# Patient Record
Sex: Male | Born: 1985 | Race: Black or African American | Hispanic: No | Marital: Married
Health system: Southern US, Community
[De-identification: ages and names within clinical notes are randomized; demographics above are authoritative.]

---

## 2013-04-05 ENCOUNTER — Emergency Department (HOSPITAL_COMMUNITY)
Admission: EM | Admit: 2013-04-05 | Discharge: 2013-04-05 | Disposition: A | Discharge status: Home / Self Care | Payer: No Typology Code available for payment source | Attending: Emergency Medicine | Admitting: Emergency Medicine

## 2013-04-05 ENCOUNTER — Encounter (HOSPITAL_COMMUNITY): Payer: Self-pay | Admitting: *Deleted

## 2013-04-05 DIAGNOSIS — S139XXA Sprain of joints and ligaments of unspecified parts of neck, initial encounter: Secondary | ICD-10-CM | POA: Insufficient documentation

## 2013-04-05 DIAGNOSIS — Y9389 Activity, other specified: Secondary | ICD-10-CM | POA: Insufficient documentation

## 2013-04-05 DIAGNOSIS — S161XXA Strain of muscle, fascia and tendon at neck level, initial encounter: Secondary | ICD-10-CM

## 2013-04-05 DIAGNOSIS — Y92410 Unspecified street and highway as the place of occurrence of the external cause: Secondary | ICD-10-CM | POA: Insufficient documentation

## 2013-04-05 MED ORDER — OXYCODONE-ACETAMINOPHEN 5-325 MG PO TABS: 1.0000 | ORAL_TABLET | Freq: Four times a day (QID) | ORAL | Status: DC | PRN

## 2013-04-05 MED ORDER — IBUPROFEN 400 MG PO TABS
600.0000 mg | ORAL_TABLET | Freq: Once | ORAL | Status: AC
  Administered 2013-04-05: 600 mg via ORAL
  Filled 2013-04-05 (×2): qty 1

## 2013-04-05 MED ORDER — IBUPROFEN 600 MG PO TABS: 600.0000 mg | ORAL_TABLET | Freq: Three times a day (TID) | ORAL | Status: DC | PRN

## 2013-04-05 MED ORDER — DIAZEPAM 5 MG PO TABS: 5.0000 mg | ORAL_TABLET | Freq: Four times a day (QID) | ORAL | Status: DC | PRN
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
## 2019-06-20 ENCOUNTER — Other Ambulatory Visit: Payer: Self-pay

## 2019-06-20 DIAGNOSIS — Z20822 Contact with and (suspected) exposure to covid-19: Secondary | ICD-10-CM

## 2019-06-22 LAB — NOVEL CORONAVIRUS, NAA: SARS-CoV-2, NAA: NOT DETECTED

## 2019-07-09 ENCOUNTER — Other Ambulatory Visit: Payer: Self-pay

## 2019-07-09 DIAGNOSIS — Z20822 Contact with and (suspected) exposure to covid-19: Secondary | ICD-10-CM

## 2019-07-10 LAB — NOVEL CORONAVIRUS, NAA: SARS-CoV-2, NAA: NOT DETECTED

## 2019-07-23 ENCOUNTER — Ambulatory Visit: Payer: Self-pay | Attending: Internal Medicine

## 2019-07-23 DIAGNOSIS — Z20822 Contact with and (suspected) exposure to covid-19: Secondary | ICD-10-CM | POA: Insufficient documentation

## 2019-07-25 LAB — NOVEL CORONAVIRUS, NAA: SARS-CoV-2, NAA: NOT DETECTED

## 2020-11-24 ENCOUNTER — Ambulatory Visit
Admission: EM | Admit: 2020-11-24 | Discharge: 2020-11-24 | Disposition: A | Discharge status: Home / Self Care | Payer: BC Managed Care – PPO | Attending: Internal Medicine | Admitting: Internal Medicine

## 2020-11-24 ENCOUNTER — Other Ambulatory Visit: Payer: Self-pay

## 2020-11-24 ENCOUNTER — Encounter: Payer: Self-pay | Admitting: Emergency Medicine

## 2020-11-24 ENCOUNTER — Ambulatory Visit (INDEPENDENT_AMBULATORY_CARE_PROVIDER_SITE_OTHER): Payer: BC Managed Care – PPO

## 2020-11-24 DIAGNOSIS — M79671 Pain in right foot: Secondary | ICD-10-CM | POA: Diagnosis not present

## 2020-11-24 DIAGNOSIS — S99922A Unspecified injury of left foot, initial encounter: Secondary | ICD-10-CM | POA: Diagnosis not present

## 2020-11-24 DIAGNOSIS — S96911A Strain of unspecified muscle and tendon at ankle and foot level, right foot, initial encounter: Secondary | ICD-10-CM

## 2020-11-24 NOTE — Discharge Instructions (Addendum)
-  Ice area of pain for 48 h 15 min at a time 4-5 times a day Elevate You may take Ibuprofen up to 800 mg every 8 hours for pain.  Follow up with ortho if pain does not improve in one week

## 2020-11-24 NOTE — ED Triage Notes (Signed)
Pt sts right foot pain after landing strangely playing basketball yesterday; pt sts pain in middle of foot near heal

## 2020-12-09 ENCOUNTER — Ambulatory Visit (HOSPITAL_BASED_OUTPATIENT_CLINIC_OR_DEPARTMENT_OTHER): Payer: Self-pay | Admitting: Nurse Practitioner

## 2021-04-27 NOTE — ED Provider Notes (Signed)
Mike Cook    CSN: [id number] Arrival date & time: 11/24/20  1814      History   Chief Complaint Chief Complaint  Patient presents with   Foot Pain    HPI Mike Cook is a 35 y.o. male who presents with R foot pain after landing strangely playing basketball yesterday. Pain is located on mid foot hear heal region. Denies paresthesia.     History reviewed. No pertinent past medical history.  There are no problems to display for this patient.  History reviewed. No pertinent surgical history.   Home Medications    Prior to Admission medications   Not on File    Family History History reviewed. No pertinent family history.  Social History Social History   Substance Use Topics   Alcohol use: No   Drug use: No     Allergies   Patient has no known allergies.   Review of Systems Review of Systems  Musculoskeletal:  Positive for arthralgias and gait problem.  Skin:  Negative for rash and wound.  Neurological:  Negative for weakness and numbness.    Physical Exam Triage Vital Signs ED Triage Vitals  Enc Vitals Group     BP 11/24/20 2015 123/68     Pulse Rate 11/24/20 2015 95     Resp 11/24/20 2015 18     Temp 11/24/20 2015 98.2 F (36.8 C)     Temp Source 11/24/20 2015 Oral     SpO2 11/24/20 2015 96 %     Weight --      Height --      Head Circumference --      Peak Flow --      Pain Score 11/24/20 2016 6     Pain Loc --      Pain Edu? --      Excl. in GC? --    No data found.  Updated Vital Signs BP 123/68 (BP Location: Left Arm)   Pulse 95   Temp 98.2 F (36.8 C) (Oral)   Resp 18   SpO2 96%   Visual Acuity Right Eye Distance:   Left Eye Distance:   Bilateral Distance:    Right Eye Near:   Left Eye Near:    Bilateral Near:     Physical Exam Vitals and nursing note reviewed.  Constitutional:      General: He is not in acute distress.    Appearance: He is not toxic-appearing.     Comments: Has a slight limp  HENT:      Head: Normocephalic.     Right Ear: External ear normal.     Left Ear: External ear normal.  Eyes:     General: No scleral icterus.    Conjunctiva/sclera: Conjunctivae normal.  Pulmonary:     Effort: Pulmonary effort is normal.  Musculoskeletal:     Cervical back: Neck supple.     Comments: R FOOT/ANKLE- normal ankle with good ROM. Has local tenderness of mid distal heel region.   Skin:    General: Skin is warm and dry.     Findings: No rash.  Neurological:     Mental Status: He is alert and oriented to person, place, and time.  Psychiatric:        Mood and Affect: Mood normal.        Behavior: Behavior normal.        Thought Content: Thought content normal.        Judgment: Judgment normal.  UC Treatments / Results  Labs (all labs ordered are listed, but only abnormal results are displayed) Labs Reviewed - No data to display  EKG   Radiology EXAM: RIGHT FOOT COMPLETE - 3+ VIEW  COMPARISON:  None.   FINDINGS: There is no evidence of fracture or dislocation. There is no evidence of arthropathy or other focal bone abnormality. Soft tissues are unremarkable.   IMPRESSION: Negative.     Electronically Signed   By: Lupita Raider M.D.   On: 11/24/2020 20:33  Procedures Procedures (including critical care time)  Medications Ordered in UC Medications - No data to display  Initial Impression / Assessment and Plan / UC Course  I have reviewed the triage vital signs and the nursing notes. Pertinent  imaging results that were available during my care of the patient were reviewed by me and considered in my medical decision making (see chart for details). R foot strain. See instructions.     Final Clinical Impressions(s) / UC Diagnoses   Final diagnoses:  Right foot strain, initial encounter     Discharge Instructions      -Ice area of pain for 48 h 15 min at a time 4-5 times a day Elevate You may take Ibuprofen up to 800 mg every 8 hours for  pain.  Follow up with ortho if pain does not improve in one week      ED Prescriptions   None    PDMP not reviewed this encounter.   Garey Ham, PA-C 04/27/21 1403

## 2022-08-10 IMAGING — DX DG FOOT COMPLETE 3+V*R*
3 series · 3 of 3 positions shown · non-contrast
Comparison: None.

CLINICAL DATA: Acute right foot pain after injury.

EXAM:
RIGHT FOOT COMPLETE - 3+ VIEW

[foot supine dp]
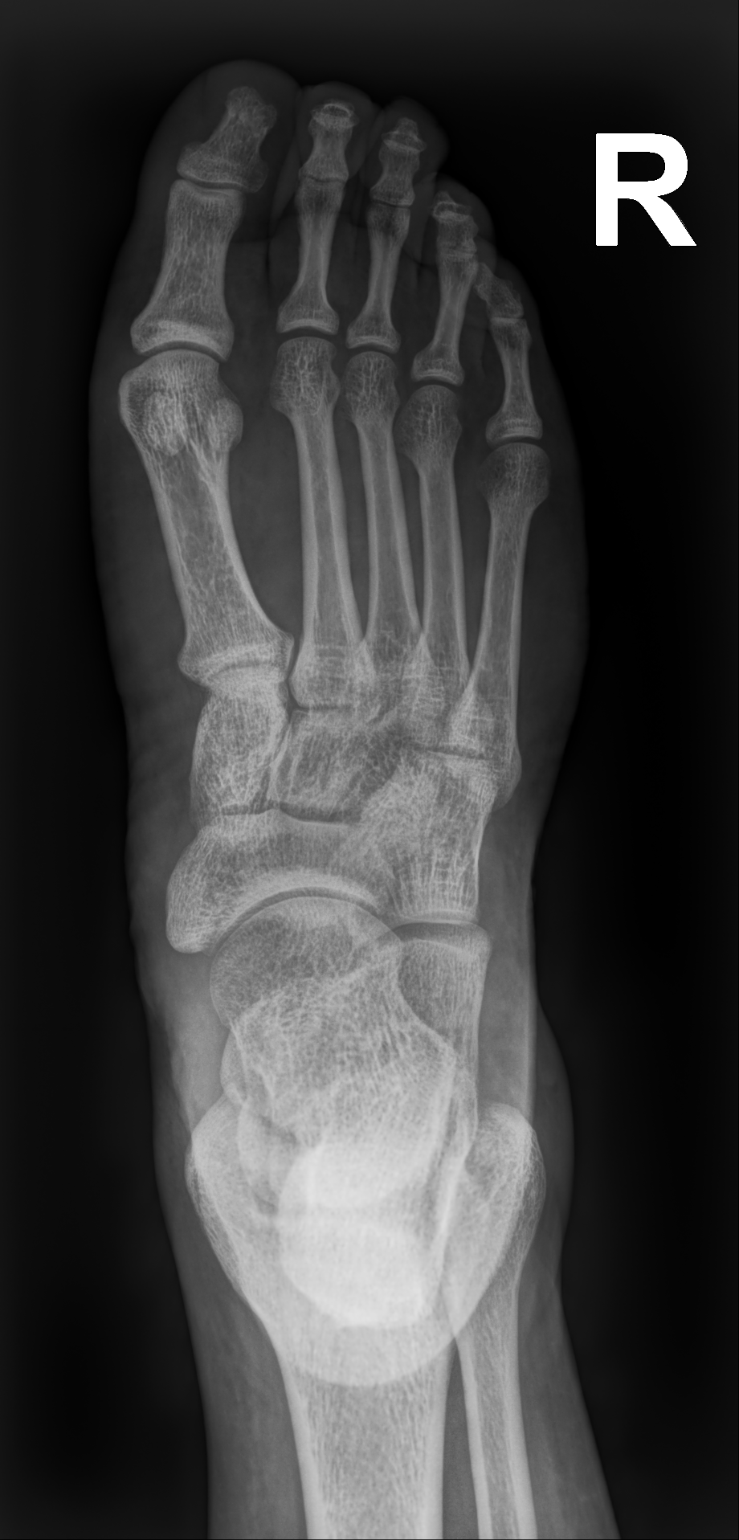

[foot medial oblique]
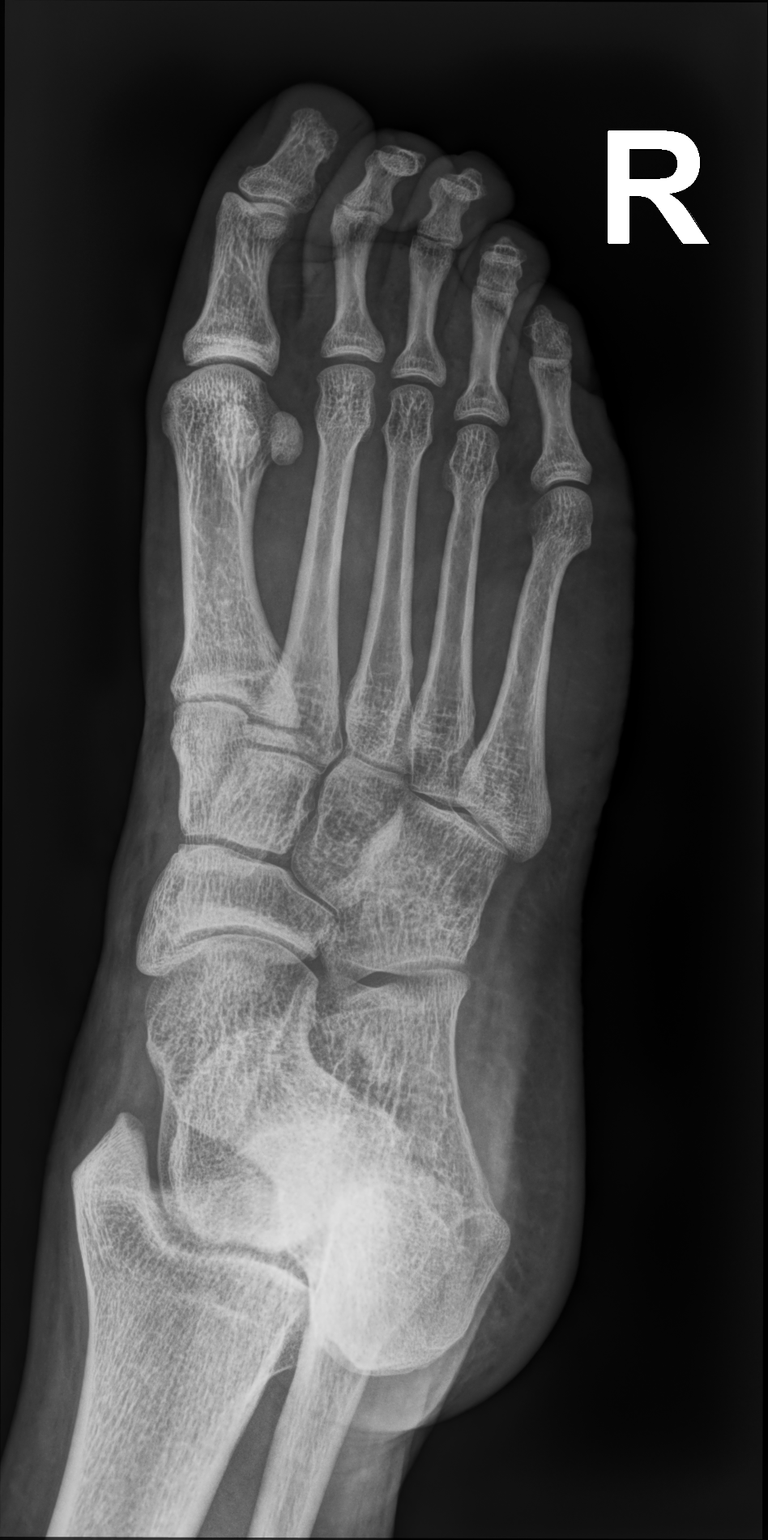

[foot supine lat]
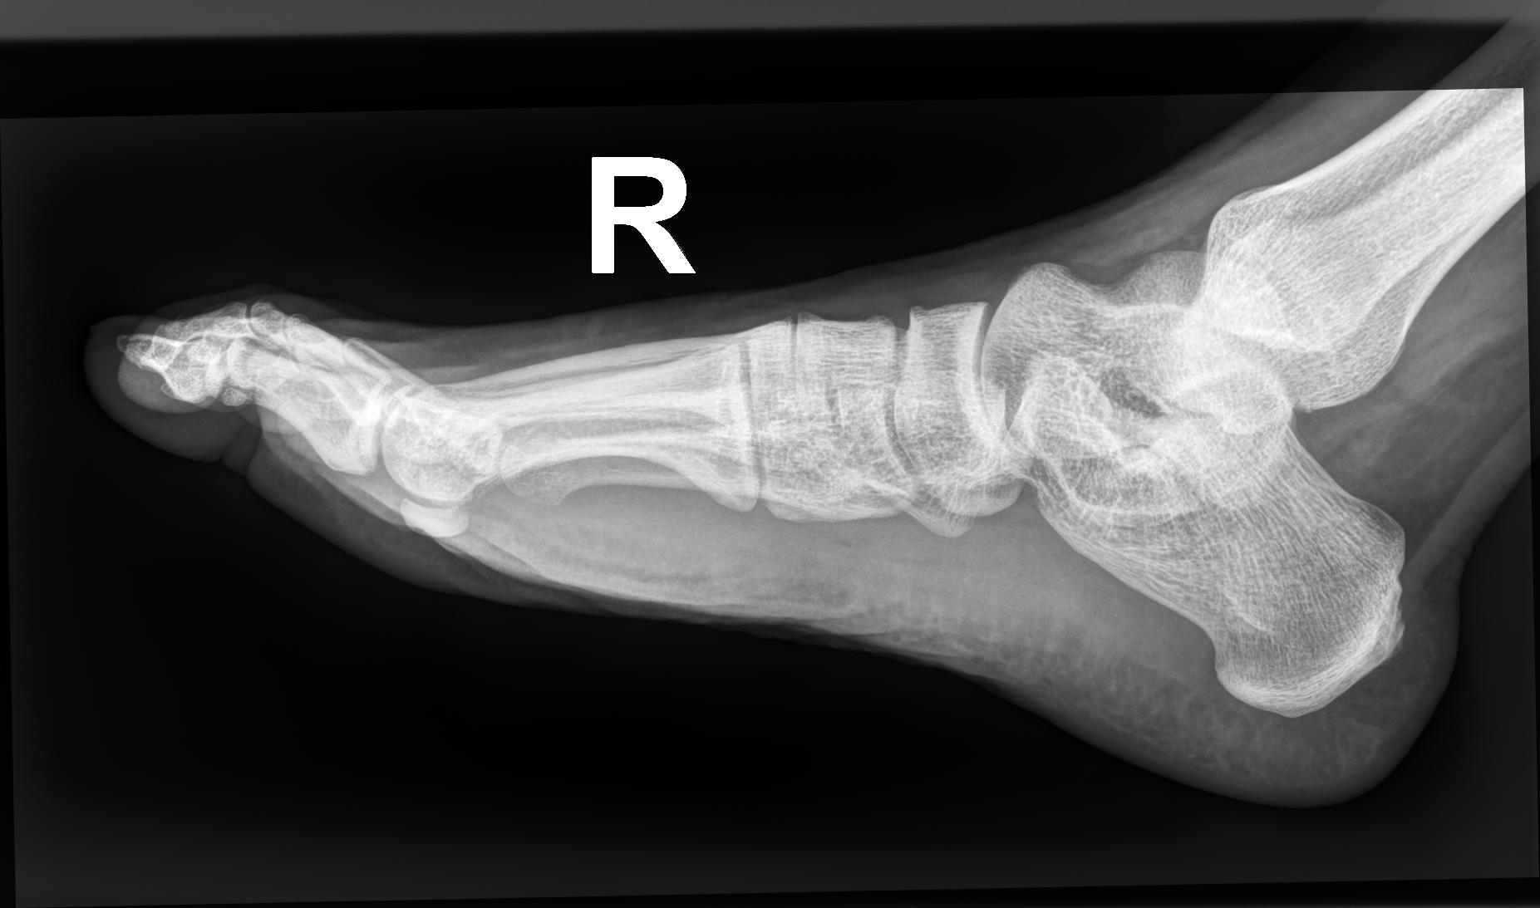

[3 of 3 positions shown; findings below may reference images not displayed]

FINDINGS: There is no evidence of fracture or dislocation. There is no
evidence of arthropathy or other focal bone abnormality. Soft
tissues are unremarkable.
IMPRESSION: Negative.
